# Patient Record
Sex: Female | Born: 1975 | Race: White | Hispanic: No | Marital: Married | Smoking: Never smoker
Health system: Southern US, Community
[De-identification: ages and names within clinical notes are randomized; demographics above are authoritative.]

## PROBLEM LIST (undated history)

## (undated) DIAGNOSIS — K589 Irritable bowel syndrome without diarrhea: Secondary | ICD-10-CM

## (undated) DIAGNOSIS — F419 Anxiety disorder, unspecified: Secondary | ICD-10-CM

## (undated) HISTORY — PX: GALLBLADDER SURGERY: SHX652

## (undated) HISTORY — PX: CHOLECYSTECTOMY: SHX55

---

## 2019-08-14 ENCOUNTER — Emergency Department (HOSPITAL_BASED_OUTPATIENT_CLINIC_OR_DEPARTMENT_OTHER)
Admission: EM | Admit: 2019-08-14 | Discharge: 2019-08-15 | Disposition: A | Discharge status: Home / Self Care | Payer: BC Managed Care – PPO | Attending: Emergency Medicine | Admitting: Emergency Medicine

## 2019-08-14 ENCOUNTER — Encounter (HOSPITAL_BASED_OUTPATIENT_CLINIC_OR_DEPARTMENT_OTHER): Payer: Self-pay | Admitting: *Deleted

## 2019-08-14 ENCOUNTER — Other Ambulatory Visit: Payer: Self-pay

## 2019-08-14 DIAGNOSIS — R112 Nausea with vomiting, unspecified: Secondary | ICD-10-CM | POA: Insufficient documentation

## 2019-08-14 DIAGNOSIS — R1084 Generalized abdominal pain: Secondary | ICD-10-CM

## 2019-08-14 DIAGNOSIS — Z9889 Other specified postprocedural states: Secondary | ICD-10-CM

## 2019-08-14 HISTORY — DX: Anxiety disorder, unspecified: F41.9

## 2019-08-14 HISTORY — DX: Irritable bowel syndrome without diarrhea: K58.9

## 2019-08-14 MED ORDER — ONDANSETRON 4 MG PO TBDP
ORAL_TABLET | ORAL | Status: AC
  Filled 2019-08-14: qty 1

## 2019-08-14 MED ORDER — ONDANSETRON 4 MG PO TBDP
4.0000 mg | ORAL_TABLET | Freq: Once | ORAL | Status: AC
  Administered 2019-08-14: 4 mg via ORAL

## 2019-08-14 NOTE — ED Triage Notes (Signed)
Gallbladder surgery today. She is here with c.o vomiting and pain. She last vomited 4 hours ago. She cannot keep her pain medication down. She took Phenergan 2 hours ago with no improvement.

## 2019-08-15 LAB — CBC
HCT: 34 % — ABNORMAL LOW (ref 36.0–46.0)
Hemoglobin: 11.6 g/dL — ABNORMAL LOW (ref 12.0–15.0)
MCH: 30.1 pg (ref 26.0–34.0)
MCHC: 34.1 g/dL (ref 30.0–36.0)
MCV: 88.3 fL (ref 80.0–100.0)
Platelets: 218 10*3/uL (ref 150–400)
RBC: 3.85 MIL/uL — ABNORMAL LOW (ref 3.87–5.11)
RDW: 13.5 % (ref 11.5–15.5)
WBC: 13.9 10*3/uL — ABNORMAL HIGH (ref 4.0–10.5)
nRBC: 0 % (ref 0.0–0.2)

## 2019-08-15 LAB — COMPREHENSIVE METABOLIC PANEL
ALT: 47 U/L — ABNORMAL HIGH (ref 0–44)
AST: 53 U/L — ABNORMAL HIGH (ref 15–41)
Albumin: 3.8 g/dL (ref 3.5–5.0)
Alkaline Phosphatase: 58 U/L (ref 38–126)
Anion gap: 10 (ref 5–15)
BUN: 7 mg/dL (ref 6–20)
CO2: 23 mmol/L (ref 22–32)
Calcium: 8.2 mg/dL — ABNORMAL LOW (ref 8.9–10.3)
Chloride: 97 mmol/L — ABNORMAL LOW (ref 98–111)
Creatinine, Ser: 0.5 mg/dL (ref 0.44–1.00)
GFR calc Af Amer: >60 mL/min (ref >60)
GFR calc non Af Amer: >60 mL/min (ref >60)
Glucose, Bld: 121 mg/dL — ABNORMAL HIGH (ref 70–99)
Potassium: 3.4 mmol/L — ABNORMAL LOW (ref 3.5–5.1)
Sodium: 130 mmol/L — ABNORMAL LOW (ref 135–145)
Total Bilirubin: 0.6 mg/dL (ref 0.3–1.2)
Total Protein: 7 g/dL (ref 6.5–8.1)

## 2019-08-15 MED ORDER — ONDANSETRON HCL 4 MG/2ML IJ SOLN
4.0000 mg | Freq: Once | INTRAMUSCULAR | Status: AC
  Administered 2019-08-15: 4 mg via INTRAVENOUS
  Filled 2019-08-15: qty 2

## 2019-08-15 MED ORDER — HYDROMORPHONE HCL 1 MG/ML IJ SOLN
0.5000 mg | Freq: Once | INTRAMUSCULAR | Status: AC
  Administered 2019-08-15: 0.5 mg via INTRAVENOUS
  Filled 2019-08-15: qty 1

## 2019-08-15 MED ORDER — OXYCODONE-ACETAMINOPHEN 5-325 MG PO TABS
0.5000 | ORAL_TABLET | Freq: Once | ORAL | Status: AC
  Administered 2019-08-15: 0.5 via ORAL
  Filled 2019-08-15: qty 1

## 2019-08-15 MED ORDER — SODIUM CHLORIDE 0.9 % IV SOLN
1000.0000 mL | INTRAVENOUS | Status: DC
  Administered 2019-08-15: 1000 mL via INTRAVENOUS

## 2019-08-15 MED ORDER — SODIUM CHLORIDE 0.9 % IV BOLUS (SEPSIS)
1000.0000 mL | Freq: Once | INTRAVENOUS | Status: AC
  Administered 2019-08-15: 1000 mL via INTRAVENOUS

## 2019-08-15 MED ORDER — ONDANSETRON 4 MG PO TBDP: 4.0000 mg | ORAL_TABLET | Freq: Three times a day (TID) | ORAL | 0 refills | Status: AC | PRN

## 2019-08-15 NOTE — ED Provider Notes (Signed)
[facility] EMERGENCY DEPARTMENT Provider Note  CSN: [id number] Arrival date & time: 08/14/19 2054  Chief Complaint(s) Abdominal Pain  HPI Marilyn Ingram is a 44 y.o. female   CC: abd pain  Onset/Duration: 12 hrs Timing: constant Location: generalized Quality: dull "soreness" Severity: mild to moderate Modifying Factors:  Improved by: nothing  Worsened by: emesis Associated Signs/Symptoms:  Pertinent (+): n/v  Pertinent (-): fevers, chills, chest pain, sob, cough Context: had Lap Chole and was DC'd this afternoon.  Initially prescribed Zofran pills which patient kept throwing up.  She called the surgeon who prescribed her Phenergan.  However she is unable to keep any of the pills down including her pain medicine.   HPI  Past Medical History Past Medical History:  Diagnosis Date  . Anxiety   . IBS (irritable bowel syndrome)    There are no problems to display for this patient.  Home Medication(s) Prior to Admission medications   Medication Sig Start Date End Date Taking? Authorizing Provider  ondansetron (ZOFRAN ODT) 4 MG disintegrating tablet Take 1 tablet (4 mg total) by mouth every 8 (eight) hours as needed for up to 3 days for nausea or vomiting. 08/15/19 08/18/19  Khamarion Bjelland, Amadeo Garnet, MD                                                                                                                                    Past Surgical History Past Surgical History:  Procedure Laterality Date  . CESAREAN SECTION    . GALLBLADDER SURGERY     Family History No family history on file.  Social History Social History   Tobacco Use  . Smoking status: Not on file  Substance Use Topics  . Alcohol use: Not on file  . Drug use: Not on file   Allergies Patient has no allergy information on record.  Review of Systems Review of Systems All other systems are reviewed and are negative for acute change except as noted in the HPI  Physical Exam Vital Signs    I have reviewed the triage vital signs BP (!) 139/92   Pulse 91   Temp 97.9 F (36.6 C) (Oral)   Resp 20   Ht 5\' 5"  (1.651 m)   Wt 81.6 kg   LMP 07/31/2019   SpO2 97%   BMI 29.95 kg/m   Physical Exam Vitals reviewed.  Constitutional:      General: She is not in acute distress.    Appearance: She is well-developed. She is not diaphoretic.  HENT:     Head: Normocephalic and atraumatic.     Nose: Nose normal.  Eyes:     General: No scleral icterus.       Right eye: No discharge.        Left eye: No discharge.     Conjunctiva/sclera: Conjunctivae normal.     Pupils: Pupils are equal, round, and reactive to light.  Cardiovascular:  Rate and Rhythm: Normal rate and regular rhythm.     Heart sounds: No murmur heard.  No friction rub. No gallop.   Pulmonary:     Effort: Pulmonary effort is normal. No respiratory distress.     Breath sounds: Normal breath sounds. No stridor. No rales.  Abdominal:     General: There is no distension.     Palpations: Abdomen is soft.     Tenderness: There is abdominal tenderness (mild discomfort). There is no guarding or rebound.     Comments: Trochar incision clean, dry, intact  Musculoskeletal:        General: No tenderness.     Cervical back: Normal range of motion and neck supple.  Skin:    General: Skin is warm and dry.     Findings: No erythema or rash.  Neurological:     Mental Status: She is alert and oriented to person, place, and time.     ED Results and Treatments Labs (all labs ordered are listed, but only abnormal results are displayed) Labs Reviewed  COMPREHENSIVE METABOLIC PANEL - Abnormal; Notable for the following components:      Result Value   Sodium 130 (*)    Potassium 3.4 (*)    Chloride 97 (*)    Glucose, Bld 121 (*)    Calcium 8.2 (*)    AST 53 (*)    ALT 47 (*)    All other components within normal limits  CBC - Abnormal; Notable for the following components:   WBC 13.9 (*)    RBC 3.85 (*)     Hemoglobin 11.6 (*)    HCT 34.0 (*)    All other components within normal limits                                                                                                                         EKG  EKG Interpretation  Date/Time:    Ventricular Rate:    PR Interval:    QRS Duration:   QT Interval:    QTC Calculation:   R Axis:     Text Interpretation:        Radiology No results found.  Pertinent labs & imaging results that were available during my care of the patient were reviewed by me and considered in my medical decision making (see chart for details).  Medications Ordered in ED Medications  sodium chloride 0.9 % bolus 1,000 mL (0 mLs Intravenous Stopped 08/15/19 0152)    Followed by  0.9 %  sodium chloride infusion (1,000 mLs Intravenous New Bag/Given 08/15/19 0157)  ondansetron (ZOFRAN-ODT) disintegrating tablet 4 mg (4 mg Oral Given 08/14/19 2105)  ondansetron (ZOFRAN) injection 4 mg (4 mg Intravenous Given 08/15/19 0036)  HYDROmorphone (DILAUDID) injection 0.5 mg (0.5 mg Intravenous Given 08/15/19 0036)  oxyCODONE-acetaminophen (PERCOCET/ROXICET) 5-325 MG per tablet 0.5 tablet (0.5 tablets Oral Given 08/15/19 0213)  Procedures Procedures  (including critical care time)  Medical Decision Making / ED Course I have reviewed the nursing notes for this encounter and the patient's prior records (if available in EHR or on provided paperwork).   Marilyn Ingram was evaluated in Emergency Department on 08/15/2019 for the symptoms described in the history of present illness. She was evaluated in the context of the global COVID-19 pandemic, which necessitated consideration that the patient might be at risk for infection with the SARS-CoV-2 virus that causes COVID-19. Institutional protocols and algorithms that pertain to the evaluation of patients at  risk for COVID-19 are in a state of rapid change based on information released by regulatory bodies including the CDC and federal and state organizations. These policies and algorithms were followed during the patient's care in the ED.  Patient presents with postoperative abdominal discomfort with nausea and emesis.  Main concern is the inability to keep anything down.  She is afebrile with stable vital signs.  Abdomen with mild discomfort but no evidence of peritonitis. Surgical incisions are well-appearing  Labs are expected with leukocytosis likely due to recent surgery/demargination from emesis. Mild hyponatremia, hypochloremia and hypokalemia I have low suspicion for serious intra-abdominal inflammatory/infectious process/bowel obstruction at this time, requiring imaging.  This was discussed with the patient and husband who chose to defer imaging at this time.  Patient was treated symptomatically with antiemetics, pain medicine and IV fluids.  She was monitored for several hours with improved pain and resolved nausea.  She was able to keep fluids and oral pain medicine down.       Final Clinical Impression(s) / ED Diagnoses Final diagnoses:  Post-operative nausea and vomiting    The patient appears reasonably screened and/or stabilized for discharge and I doubt any other medical condition or other [facility] requiring further screening, evaluation, or treatment in the ED at this time prior to discharge. Safe for discharge with strict return precautions.  Disposition: Discharge  Condition: Good  I have discussed the results, Dx and Tx plan with the patient/family who expressed understanding and agree(s) with the plan. Discharge instructions discussed at length. The patient/family was given strict return precautions who verbalized understanding of the instructions. No further questions at time of discharge.    ED Discharge Orders         Ordered    ondansetron (ZOFRAN ODT) 4 MG  disintegrating tablet  Every 8 hours PRN     Discontinue  Reprint     08/15/19 0208             Follow Up: Enzo Bi, MD [address] [place] [address] [phone]  Call  As needed     This chart was dictated using voice recognition software.  Despite best efforts to proofread,  errors can occur which can change the documentation meaning.   Nira Conn, MD 08/15/19 [phone]

## 2019-08-19 ENCOUNTER — Encounter (HOSPITAL_BASED_OUTPATIENT_CLINIC_OR_DEPARTMENT_OTHER): Payer: Self-pay | Admitting: *Deleted

## 2019-08-19 ENCOUNTER — Emergency Department (HOSPITAL_BASED_OUTPATIENT_CLINIC_OR_DEPARTMENT_OTHER)
Admission: EM | Admit: 2019-08-19 | Discharge: 2019-08-19 | Disposition: A | Discharge status: Home / Self Care | Payer: BC Managed Care – PPO | Attending: Emergency Medicine | Admitting: Emergency Medicine

## 2019-08-19 ENCOUNTER — Emergency Department (HOSPITAL_BASED_OUTPATIENT_CLINIC_OR_DEPARTMENT_OTHER): Payer: BC Managed Care – PPO

## 2019-08-19 ENCOUNTER — Other Ambulatory Visit: Payer: Self-pay

## 2019-08-19 DIAGNOSIS — M79605 Pain in left leg: Secondary | ICD-10-CM | POA: Insufficient documentation

## 2019-08-19 DIAGNOSIS — Z79899 Other long term (current) drug therapy: Secondary | ICD-10-CM | POA: Diagnosis not present

## 2019-08-19 DIAGNOSIS — R059 Cough, unspecified: Secondary | ICD-10-CM

## 2019-08-19 DIAGNOSIS — R05 Cough: Secondary | ICD-10-CM | POA: Insufficient documentation

## 2019-08-19 NOTE — ED Notes (Signed)
ED Provider at bedside. 

## 2019-08-19 NOTE — ED Triage Notes (Signed)
Surgery this past Monday, cholecystectomy, has had some N/V. Yesterday began having pain behind left knee, developed a dry hack cough recently as well. Slight Homan's sign in left leg. Denies any SOB at this time

## 2019-08-19 NOTE — ED Provider Notes (Signed)
[facility] EMERGENCY DEPARTMENT Provider Note   CSN: [id number] Arrival date & time: 08/19/19  0935     History Chief Complaint  Patient presents with   Leg Pain    Noheli Melder is a 44 y.o. female p.o. day 5 from lap chole who presents today for evaluation of cough and pain behind her left knee.  She reports that yesterday she started developing pain behind her right knee.  She does not have a history of DVT/PE.  She denies any leg swelling.  He states that about 3 days ago she developed a dry cough.  She is fully vaccinated against coronavirus.  She denies any fevers.  She reports that her abdomen has bruised around the incision sites however overall she is improving and doing better.  She states that she has been up and moving around as much as she can.  She denies any chest pain.  HPI     Past Medical History:  Diagnosis Date   Anxiety    IBS (irritable bowel syndrome)     There are no problems to display for this patient.   Past Surgical History:  Procedure Laterality Date   CESAREAN SECTION     CHOLECYSTECTOMY     GALLBLADDER SURGERY       OB History   No obstetric history on file.     No family history on file.  Social History   Tobacco Use   Smoking status: Never Smoker   Smokeless tobacco: Never Used  Vaping Use   Vaping Use: Never used  Substance Use Topics   Alcohol use: Never   Drug use: Never    Home Medications Prior to Admission medications   Medication Sig Start Date End Date Taking? Authorizing Provider  acetaminophen (TYLENOL) 325 MG tablet Take 325 mg by mouth every 6 (six) hours as needed.   Yes [provider]  buPROPion (WELLBUTRIN) 100 MG tablet Take 100 mg by mouth 2 (two) times daily.   Yes [provider]  HYDROcodone-acetaminophen (NORCO/VICODIN) 5-325 MG tablet Take 1 tablet by mouth every 6 (six) hours as needed for moderate pain.   Yes [provider]  pantoprazole (PROTONIX)  20 MG tablet Take 20 mg by mouth daily.   Yes [provider]  promethazine (PHENERGAN) 12.5 MG tablet Take 12.5 mg by mouth every 6 (six) hours as needed. 08/14/19   [provider]    Allergies    Patient has no known allergies.  Review of Systems   Review of Systems  Constitutional: Negative for chills and fever.  Respiratory: Positive for cough.   Gastrointestinal: Positive for abdominal pain (IMproving).  Genitourinary: Negative for dysuria.  Musculoskeletal:       Pain in left popliteal area  Neurological: Negative for weakness and headaches.  All other systems reviewed and are negative.   Physical Exam Updated Vital Signs BP 127/79 (BP Location: Right Arm)    Pulse 89    Temp 98.2 F (36.8 C)    Resp 18    Ht 5\' 5"  (1.651 m)    Wt 81.6 kg    LMP 07/31/2019    SpO2 100%    BMI 29.95 kg/m   Physical Exam Vitals and nursing note reviewed.  Constitutional:      General: She is not in acute distress.    Appearance: She is well-developed. She is not diaphoretic.  HENT:     Head: Normocephalic and atraumatic.  Eyes:     General:  No scleral icterus.       Right eye: No discharge.        Left eye: No discharge.     Conjunctiva/sclera: Conjunctivae normal.  Cardiovascular:     Rate and Rhythm: Normal rate and regular rhythm.     Pulses: Normal pulses.     Heart sounds: Normal heart sounds.  Pulmonary:     Effort: Pulmonary effort is normal. No respiratory distress.     Breath sounds: Normal breath sounds. No stridor.     Comments: Frequent dry, nonproductive cough Abdominal:     General: There is no distension.  Musculoskeletal:        General: No deformity.     Cervical back: Normal range of motion.     Right lower leg: No edema.     Left lower leg: No edema.  Skin:    General: Skin is warm and dry.  Neurological:     General: No focal deficit present.     Mental Status: She is alert.     Motor: No abnormal muscle tone.  Psychiatric:         Mood and Affect: Mood normal.        Behavior: Behavior normal.     ED Results / Procedures / Treatments   Labs (all labs ordered are listed, but only abnormal results are displayed) Labs Reviewed - No data to display  EKG None  Radiology DG Chest 2 View  Result Date: 08/19/2019 CLINICAL DATA:  Cough. EXAM: CHEST - 2 VIEW COMPARISON:  None. FINDINGS: The heart size and mediastinal contours are within normal limits. Both lungs are clear. The visualized skeletal structures are unremarkable. IMPRESSION: No active cardiopulmonary disease. Electronically Signed   By: Lupita Raider M.D.   On: 08/19/2019 11:33   US Venous Img Lower  Left (DVT Study)  Result Date: 08/19/2019 CLINICAL DATA:  Left posterior knee pain radiating to the foot for 2 days. Status post cholecystectomy on 08/14/2019 EXAM: LEFT LOWER EXTREMITY VENOUS DOPPLER ULTRASOUND TECHNIQUE: Gray-scale sonography with compression, as well as color and duplex ultrasound, were performed to evaluate the deep venous system(s) from the level of the common femoral vein through the popliteal and proximal calf veins. COMPARISON:  None. FINDINGS: VENOUS Normal compressibility of the common femoral, superficial femoral, and popliteal veins, as well as the visualized calf veins. Visualized portions of profunda femoral vein and great saphenous vein unremarkable. No filling defects to suggest DVT on grayscale or color Doppler imaging. Doppler waveforms show normal direction of venous flow, normal respiratory plasticity and response to augmentation. Limited views of the contralateral common femoral vein are unremarkable. OTHER None. Limitations: none IMPRESSION: No evidence of DVT in the left lower extremity. Electronically Signed   By: Emmaline Kluver M.D.   On: 08/19/2019 11:32    Procedures Procedures (including critical care time)  Medications Ordered in ED Medications - No data to display  ED Course  I have reviewed the triage vital signs  and the nursing notes.  Pertinent labs & imaging results that were available during my care of the patient were reviewed by me and considered in my medical decision making (see chart for details).    MDM Rules/Calculators/A&P                          Patient is a 44 year old woman who presents today for evaluation of cough and pain behind her left knee.  She is postop recent  cholecystectomy.  No history of DVT.  Based on her recent surgery she however is at increased risk for DVT.  DVT study was performed without evidence of DVT.  Based on her cough chest x-ray was performed without evidence of consolidation, pneumothorax, or other cause for her symptoms.  She is afebrile, not tachycardic or tachypneic and at 100% on room air.  Recommended continued observation outpatient along with close follow-up with her surgeon and primary care doctor.  She is fully vaccinated against covid.   Return precautions were discussed with patient who states their understanding.  At the time of discharge patient denied any unaddressed complaints or concerns.  Patient is agreeable for discharge home.  Note: Portions of this report may have been transcribed using voice recognition software. Every effort was made to ensure accuracy; however, inadvertent computerized transcription errors may be present   Final Clinical Impression(s) / ED Diagnoses Final diagnoses:  Left leg pain  Cough    Rx / DC Orders ED Discharge Orders    None       Cristina Gong, PA-C 08/19/19 1827    Terald Sleeper, MD 08/20/19 [phone]

## 2019-08-19 NOTE — Discharge Instructions (Signed)
Please make sure you are drinking plenty of water and staying well hydrated.  If you develop fevers, worsening pain in your leg, significant swelling in your leg, shortness of breath, chest pain, or have other concerns please seek additional medical care and evaluation.  Anytime you are concerned enough about an issue to seek evaluation in the emergency room I always recommend that you schedule a follow-up appointment with your primary care doctor in the next week.

## 2021-02-23 IMAGING — US US EXTREM LOW VENOUS*L*
1 series · 14 of 24 positions shown · non-contrast
Comparison: None.

CLINICAL DATA: Left posterior knee pain radiating to the foot for 2
days. Status post cholecystectomy on 08/14/2019

EXAM:
LEFT LOWER EXTREMITY VENOUS DOPPLER ULTRASOUND
TECHNIQUE: Gray-scale sonography with compression, as well as color and duplex
ultrasound, were performed to evaluate the deep venous system(s)
from the level of the common femoral vein through the popliteal and
proximal calf veins.

[Series 1: us extrem low venous*left* · 14 of 32 slices shown]
[im 1/32]
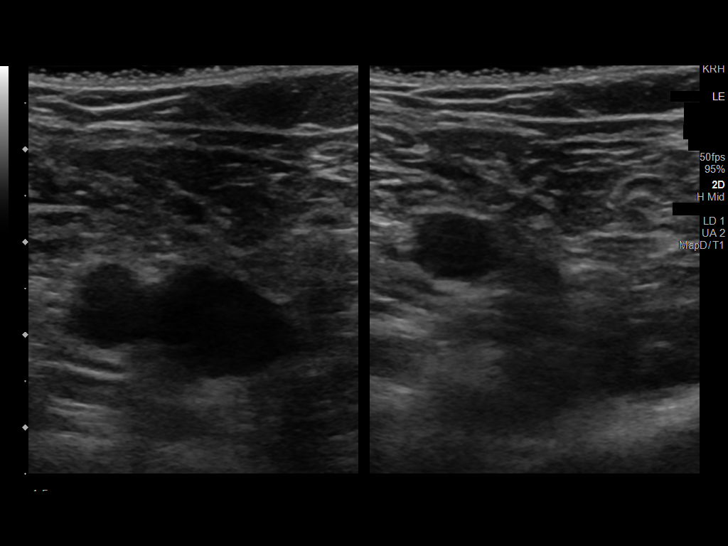
[im 3/32]
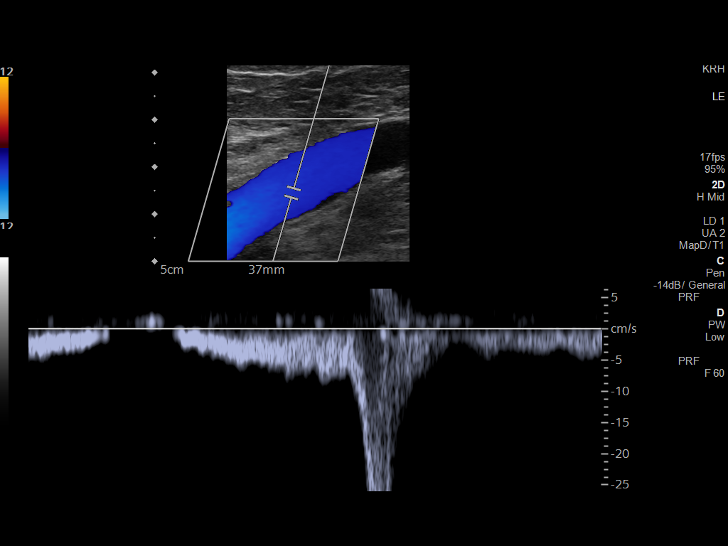
[im 6/32]
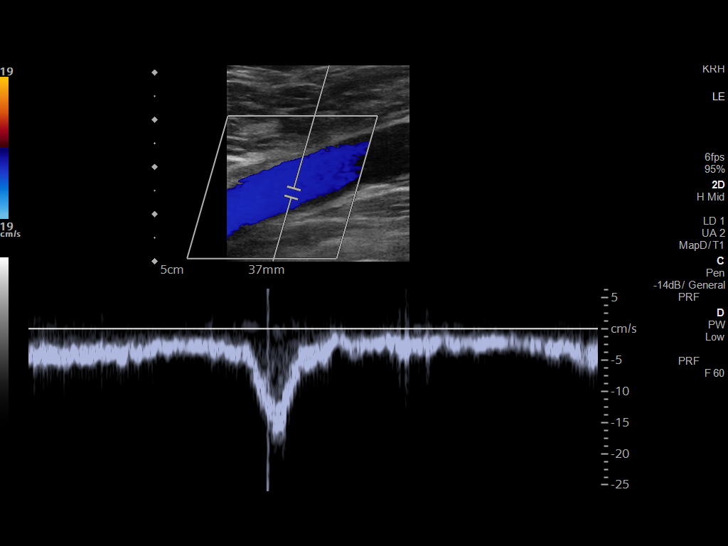
[im 9/32]
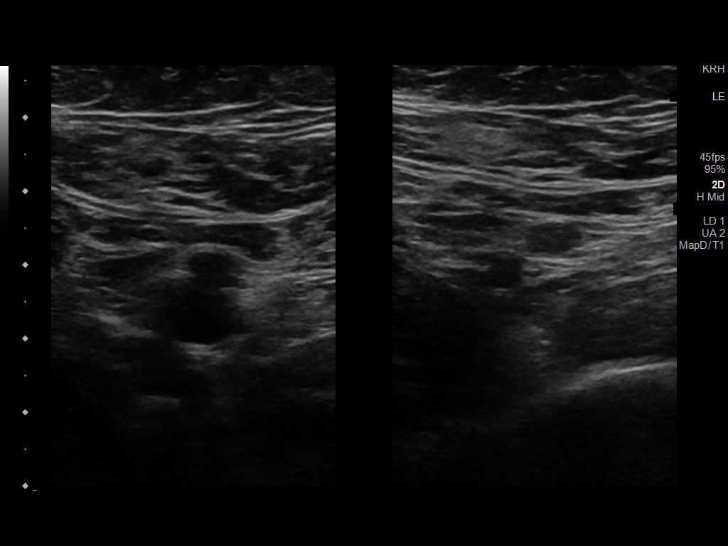
[im 10/32]
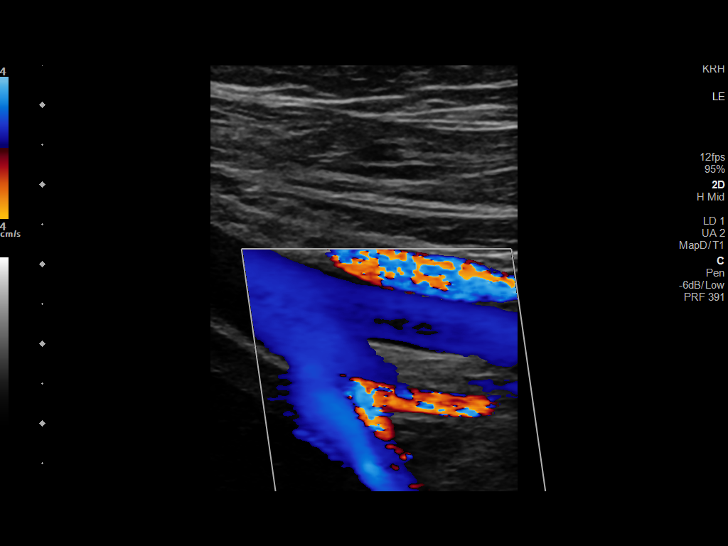
[im 13/32]
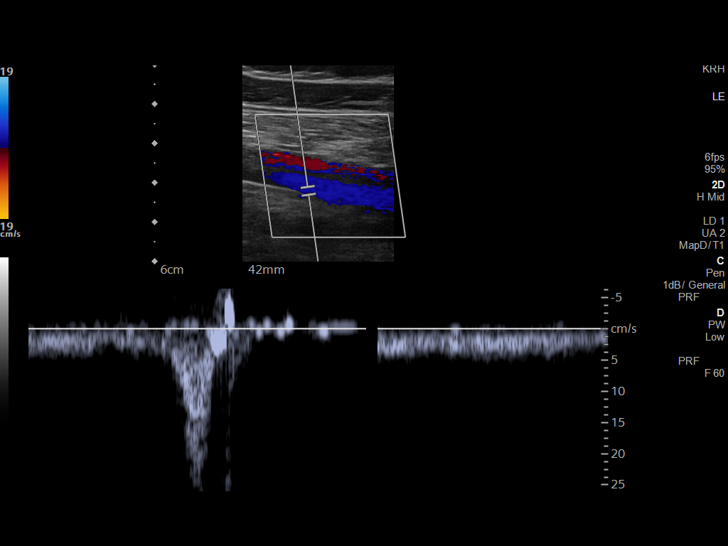
[im 15/32]
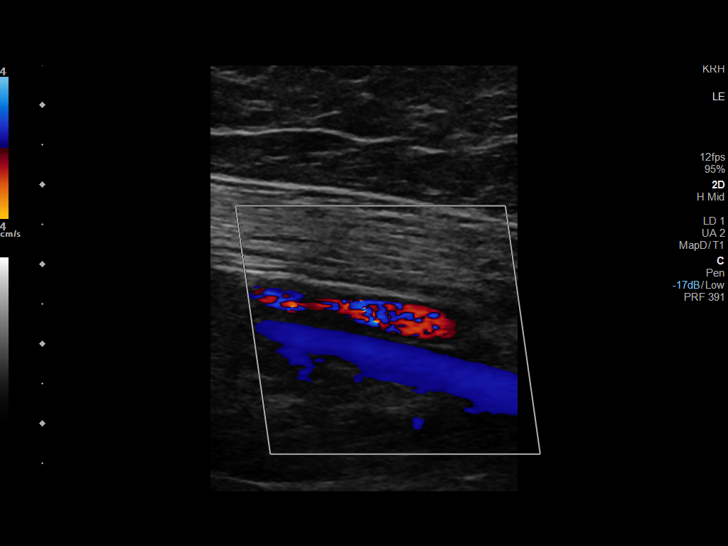
[im 17/32]
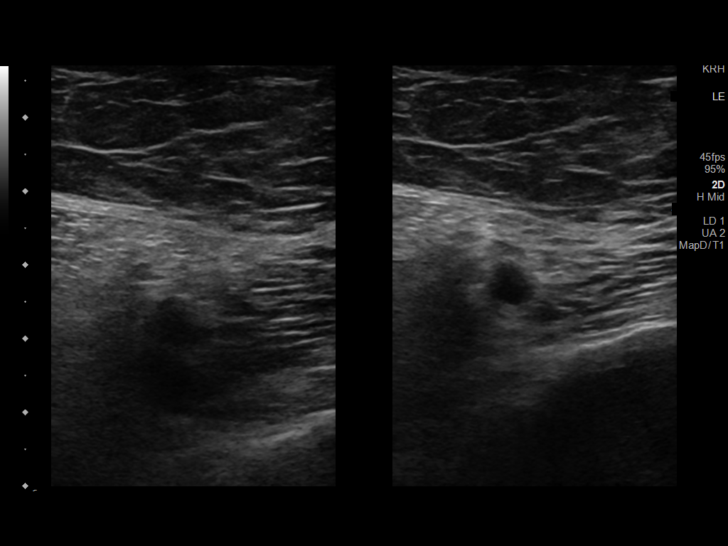
[im 19/32]
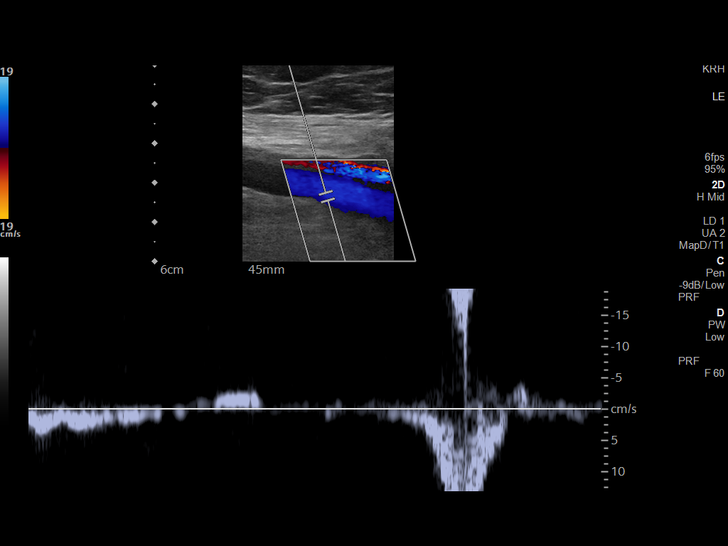
[im 22/32]
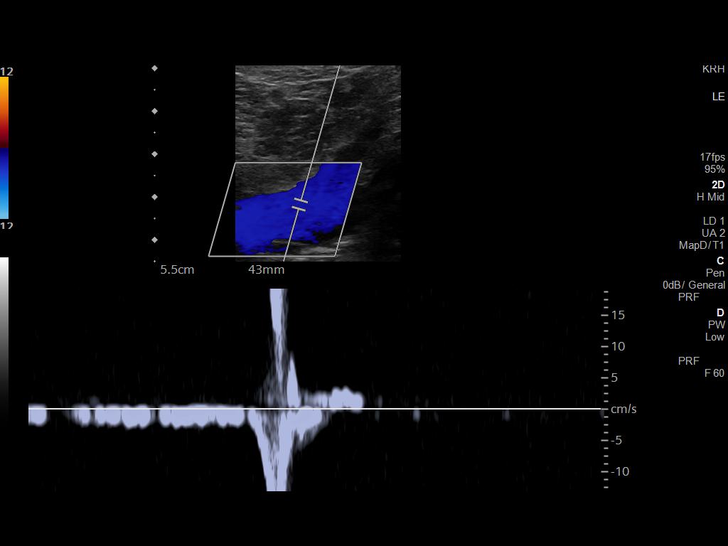
[im 25/32]
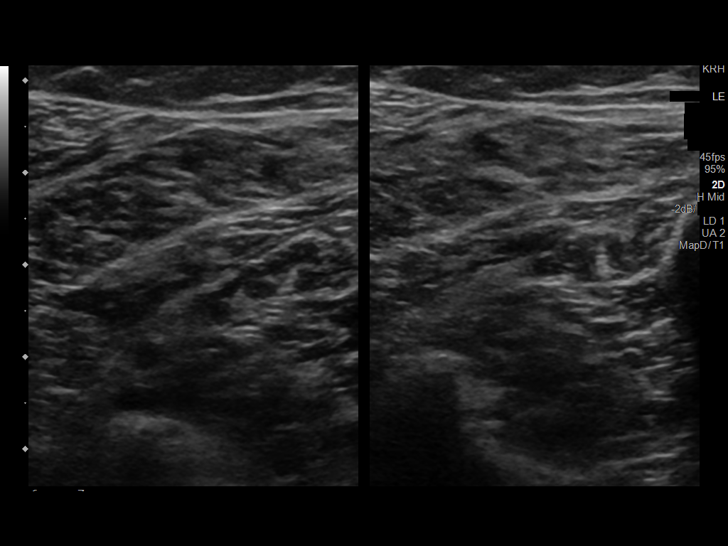
[im 26/32]
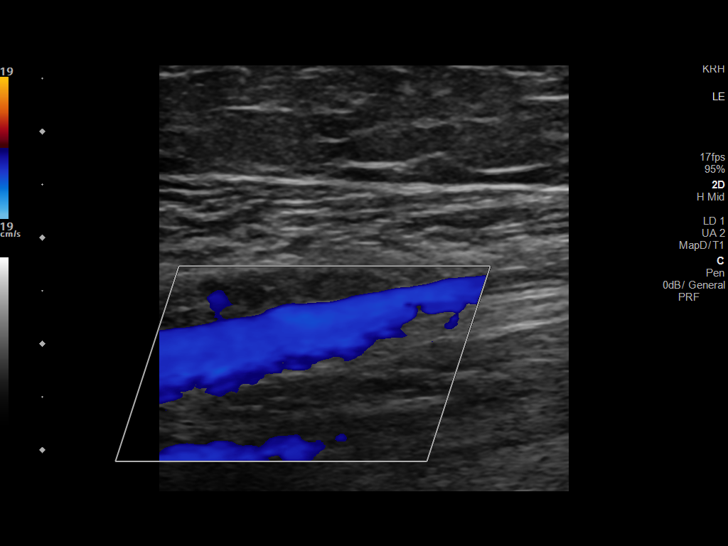
[im 29/32]
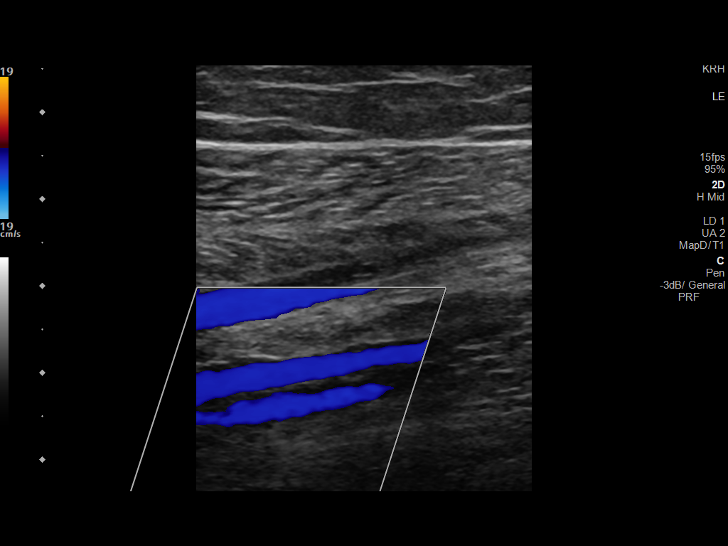
[im 32/32]
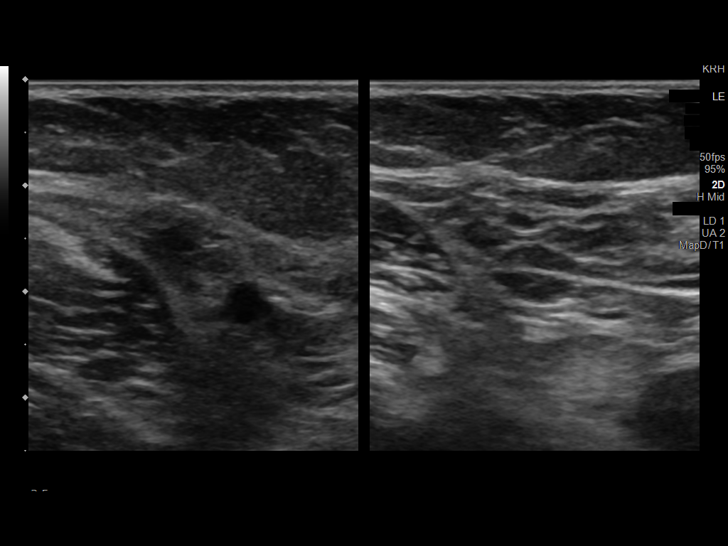

[14 of 24 positions shown; findings below may reference images not displayed]

FINDINGS: VENOUS

Normal compressibility of the common femoral, superficial femoral,
and popliteal veins, as well as the visualized calf veins.
Visualized portions of profunda femoral vein and great saphenous
vein unremarkable. No filling defects to suggest DVT on grayscale or
color Doppler imaging. Doppler waveforms show normal direction of
venous flow, normal respiratory plasticity and response to
augmentation.

Limited views of the contralateral common femoral vein are
unremarkable.

OTHER

None.

Limitations: none
IMPRESSION: No evidence of DVT in the left lower extremity.

## 2021-02-23 IMAGING — CR DG CHEST 2V
2 series · 2 of 2 positions shown · non-contrast
Comparison: None.

CLINICAL DATA: Cough.

EXAM:
CHEST - 2 VIEW

[w chest pa]
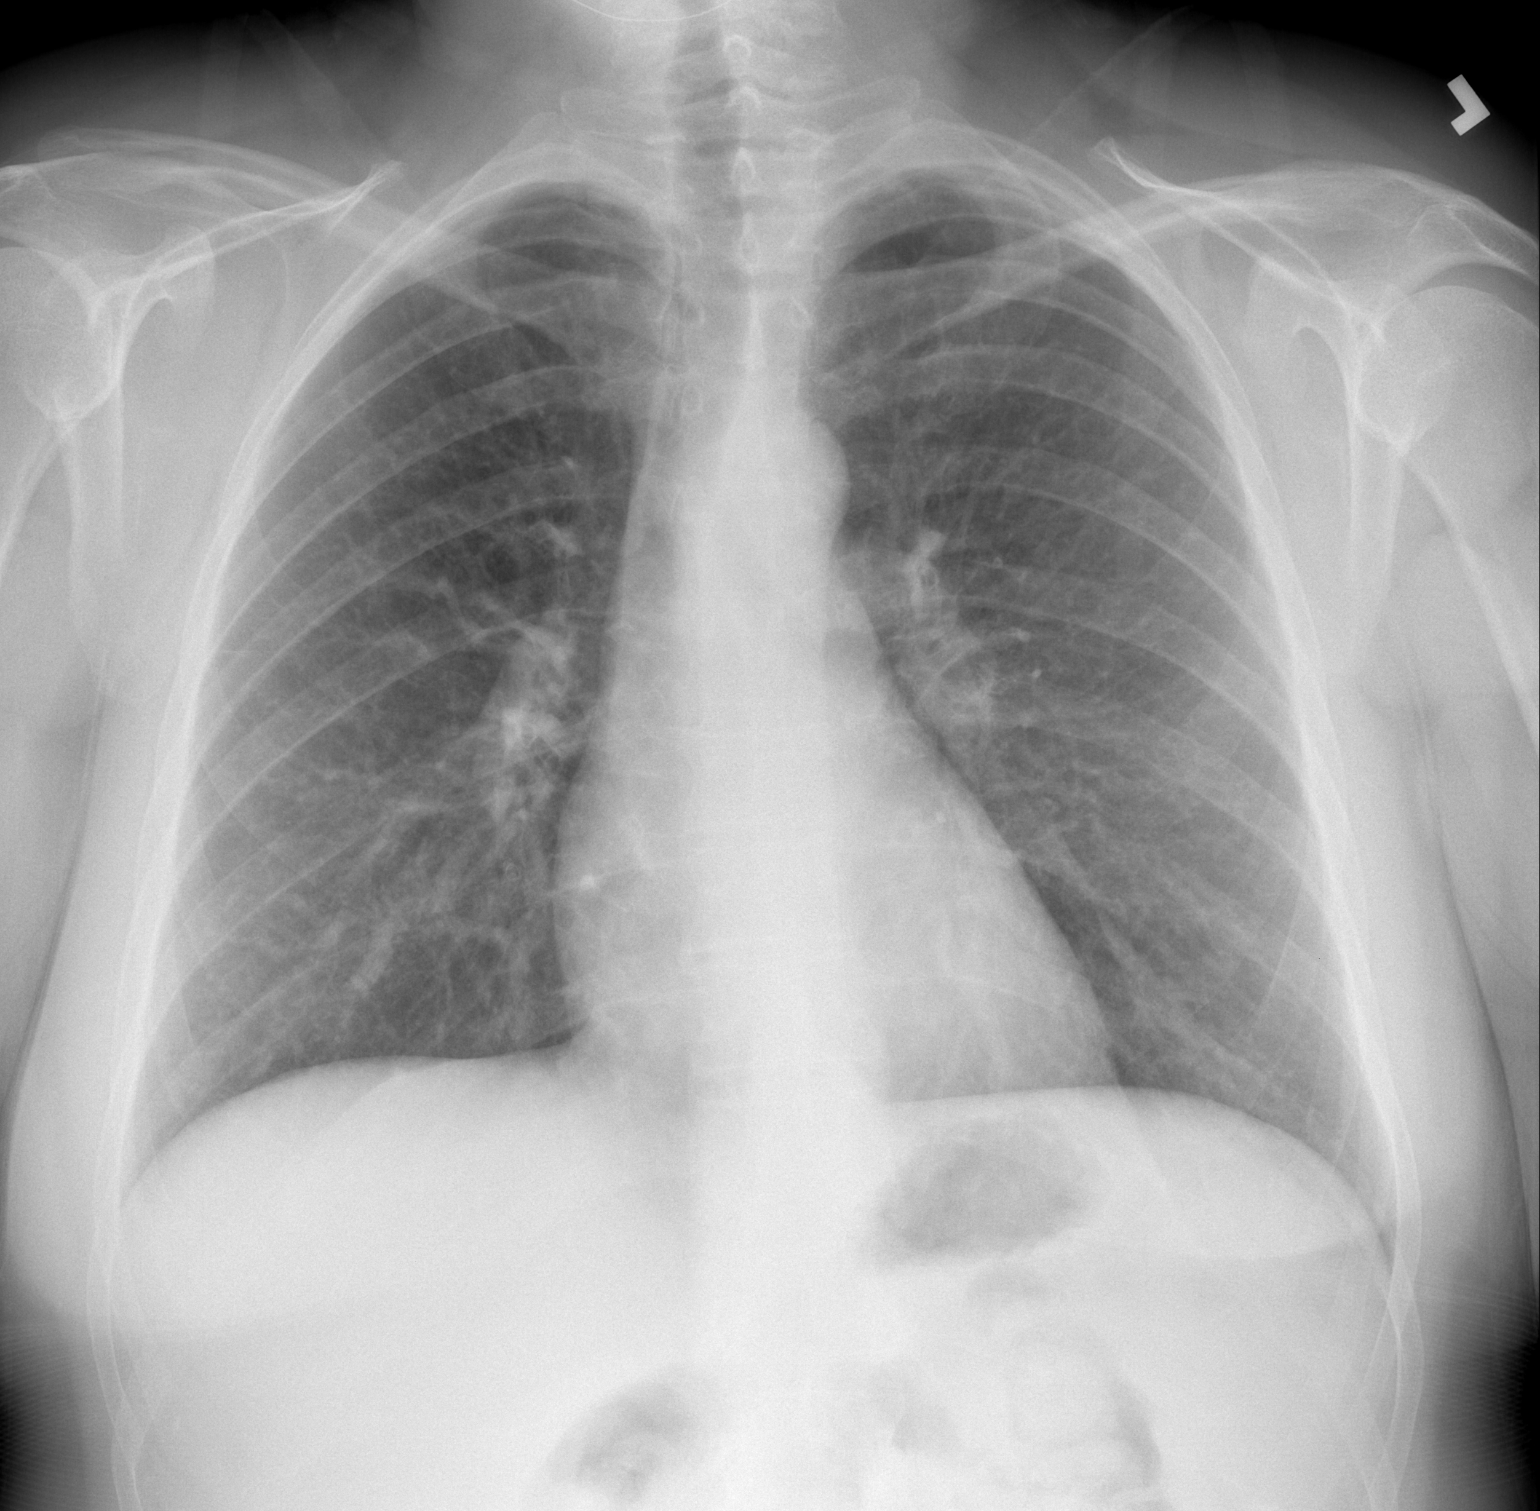

[w chest lat]
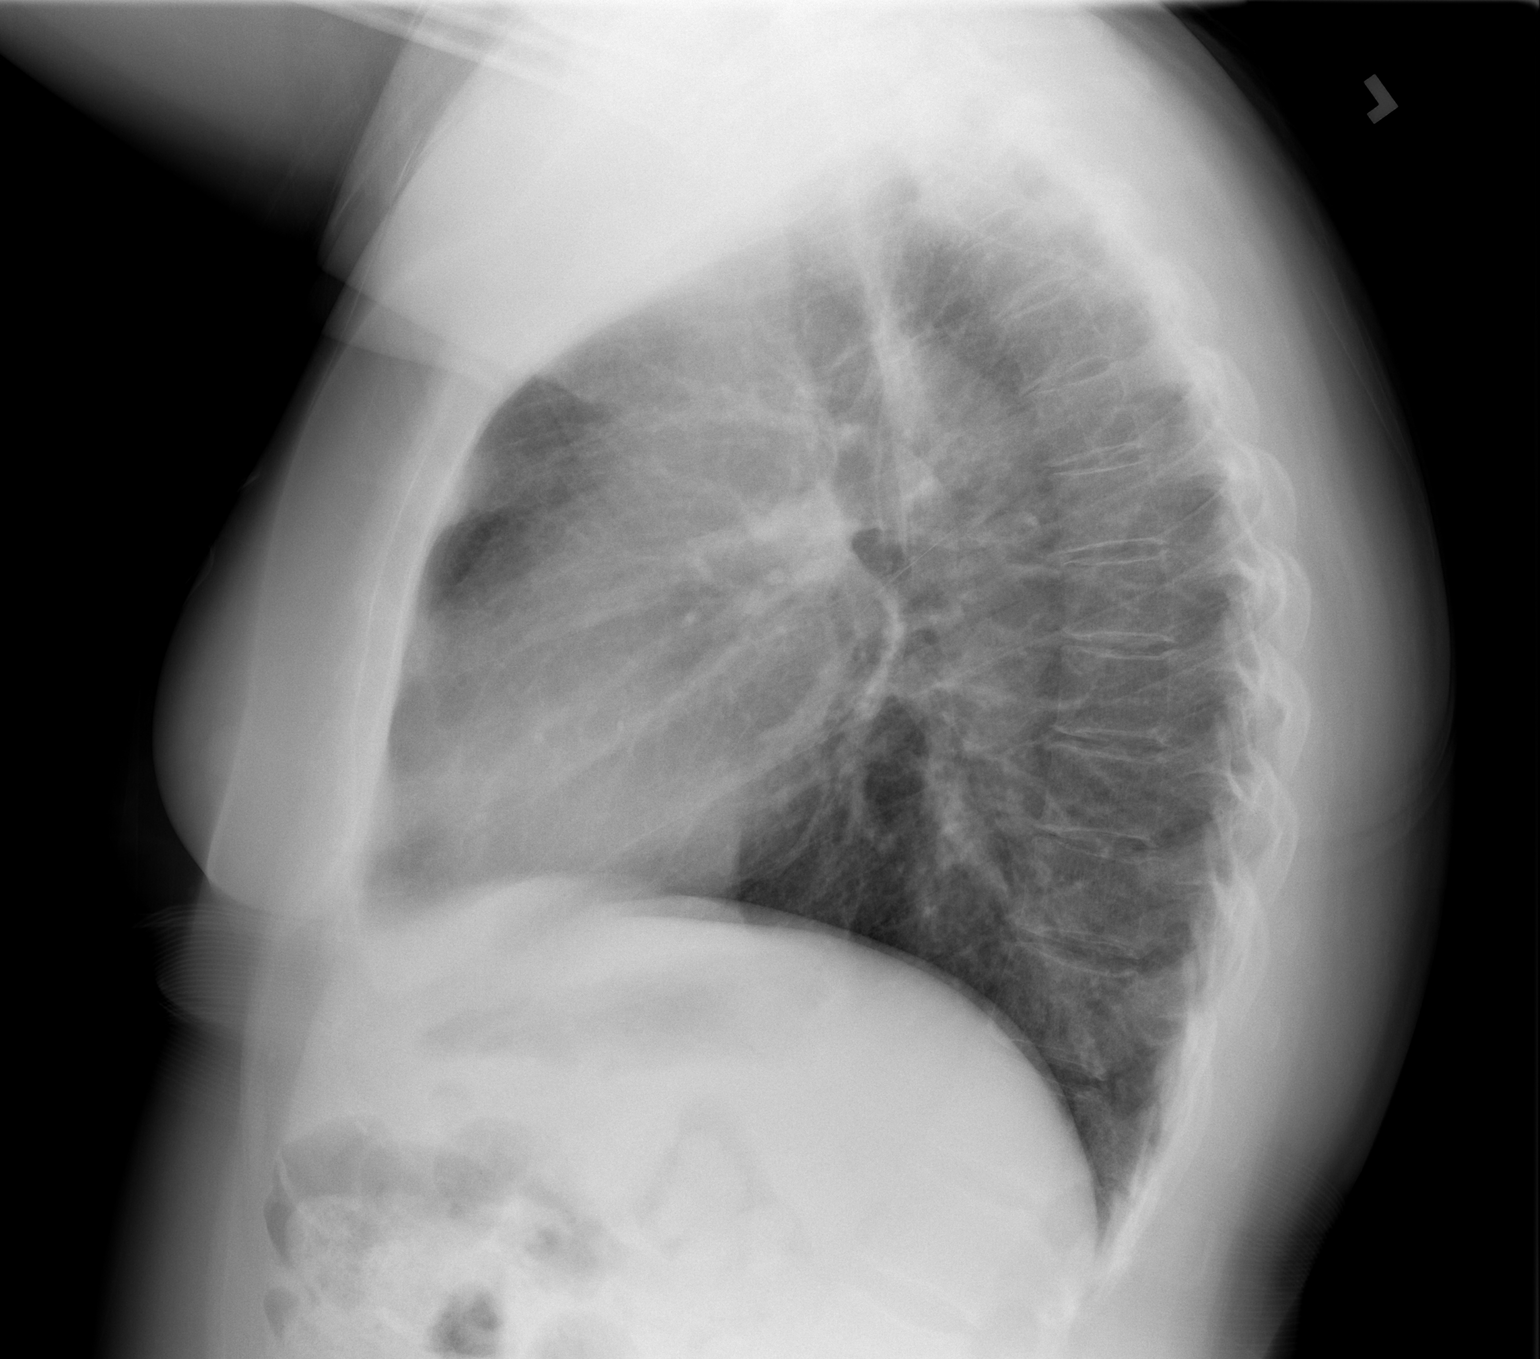

[2 of 2 positions shown; findings below may reference images not displayed]

FINDINGS: The heart size and mediastinal contours are within normal limits.
Both lungs are clear. The visualized skeletal structures are
unremarkable.
IMPRESSION: No active cardiopulmonary disease.

## 2022-04-12 ENCOUNTER — Emergency Department (HOSPITAL_BASED_OUTPATIENT_CLINIC_OR_DEPARTMENT_OTHER): Payer: BC Managed Care – PPO

## 2022-04-12 ENCOUNTER — Emergency Department (HOSPITAL_BASED_OUTPATIENT_CLINIC_OR_DEPARTMENT_OTHER)
Admission: EM | Admit: 2022-04-12 | Discharge: 2022-04-12 | Disposition: A | Discharge status: Home / Self Care | Payer: BC Managed Care – PPO | Attending: Emergency Medicine | Admitting: Emergency Medicine

## 2022-04-12 ENCOUNTER — Other Ambulatory Visit: Payer: Self-pay

## 2022-04-12 ENCOUNTER — Encounter (HOSPITAL_BASED_OUTPATIENT_CLINIC_OR_DEPARTMENT_OTHER): Payer: Self-pay | Admitting: Emergency Medicine

## 2022-04-12 DIAGNOSIS — R0789 Other chest pain: Secondary | ICD-10-CM | POA: Diagnosis present

## 2022-04-12 LAB — CBC WITH DIFFERENTIAL/PLATELET
Abs Immature Granulocytes: 0.02 10*3/uL (ref 0.00–0.07)
Basophils Absolute: 0 10*3/uL (ref 0.0–0.1)
Basophils Relative: 1 %
Eosinophils Absolute: 0.1 10*3/uL (ref 0.0–0.5)
Eosinophils Relative: 2 %
HCT: 41.4 % (ref 36.0–46.0)
Hemoglobin: 14.2 g/dL (ref 12.0–15.0)
Immature Granulocytes: 0 %
Lymphocytes Relative: 26 %
Lymphs Abs: 1.7 10*3/uL (ref 0.7–4.0)
MCH: 30.2 pg (ref 26.0–34.0)
MCHC: 34.3 g/dL (ref 30.0–36.0)
MCV: 88.1 fL (ref 80.0–100.0)
Monocytes Absolute: 0.5 10*3/uL (ref 0.1–1.0)
Monocytes Relative: 8 %
Neutro Abs: 4.2 10*3/uL (ref 1.7–7.7)
Neutrophils Relative %: 63 %
Platelets: 188 10*3/uL (ref 150–400)
RBC: 4.7 MIL/uL (ref 3.87–5.11)
RDW: 12.6 % (ref 11.5–15.5)
WBC: 6.7 10*3/uL (ref 4.0–10.5)
nRBC: 0 % (ref 0.0–0.2)

## 2022-04-12 LAB — BASIC METABOLIC PANEL WITH GFR
Anion gap: 8 (ref 5–15)
BUN: 15 mg/dL (ref 6–20)
CO2: 25 mmol/L (ref 22–32)
Calcium: 9.3 mg/dL (ref 8.9–10.3)
Chloride: 103 mmol/L (ref 98–111)
Creatinine, Ser: 0.74 mg/dL (ref 0.44–1.00)
GFR, Estimated: >60 mL/min
Glucose, Bld: 100 mg/dL — ABNORMAL HIGH (ref 70–99)
Potassium: 3.8 mmol/L (ref 3.5–5.1)
Sodium: 136 mmol/L (ref 135–145)

## 2022-04-12 LAB — TROPONIN I (HIGH SENSITIVITY): Troponin I (High Sensitivity): <2 ng/L (ref <18)

## 2022-04-12 MED ORDER — ALUM & MAG HYDROXIDE-SIMETH 200-200-20 MG/5ML PO SUSP
30.0000 mL | Freq: Once | ORAL | Status: AC
  Administered 2022-04-12: 30 mL via ORAL
  Filled 2022-04-12: qty 30

## 2022-04-12 NOTE — ED Notes (Signed)
Patient transported to X-ray 

## 2022-04-12 NOTE — ED Triage Notes (Signed)
Pt reports L sided chest pain that radiates to back and shoulder blade. Pt states it initially started on Friday but resolved after Tums. She states that this morning it woke her from sleep and was worse than it has been. She also reports some lightheadedness and intermittent diaphoresis. She has hx of cholecystectomy. Denies nausea.

## 2022-04-12 NOTE — ED Provider Notes (Signed)
[facility]  Provider Note  CSN: [id number] Arrival date & time: 04/12/22 0547  History Chief Complaint  Patient presents with   Chest Pain    Marilyn Ingram is a 47 y.o. female with history of GERD, prior cholecystectomy here with left sided chest pressure onset about 24 hours ago, woke her from sleep then, seemed to be improved after TUMS but continued through the day yesterday and woke her up again twice during the night tonight. She reports discomfort and pressure more than pain. Radiates into LUE and L upper back. She denies any SOB. Has had mild cough but no fever. She denies any HTN, DM or HDL. No known CAD. She denies recent travel, surgery, leg swelling or estrogen use.    Home Medications Prior to Admission medications   Medication Sig Start Date End Date Taking? Authorizing Provider  acetaminophen (TYLENOL) 325 MG tablet Take 325 mg by mouth every 6 (six) hours as needed.    [provider]  buPROPion (WELLBUTRIN) 100 MG tablet Take 100 mg by mouth 2 (two) times daily.    [provider]  HYDROcodone-acetaminophen (NORCO/VICODIN) 5-325 MG tablet Take 1 tablet by mouth every 6 (six) hours as needed for moderate pain.    [provider]  pantoprazole (PROTONIX) 20 MG tablet Take 20 mg by mouth daily.    [provider]  promethazine (PHENERGAN) 12.5 MG tablet Take 12.5 mg by mouth every 6 (six) hours as needed. 08/14/19   [provider]     Allergies    Patient has no known allergies.   Review of Systems   Review of Systems Please see HPI for pertinent positives and negatives  Physical Exam BP 136/74 (BP Location: Right Arm)   Pulse 99   Resp 20   Ht '5\' 5"'$  (1.651 m)   Wt 83.9 kg   LMP 03/22/2022   SpO2 100%   BMI 30.79 kg/m   Physical Exam Vitals and nursing note reviewed.  Constitutional:      Appearance: Normal appearance.  HENT:     Head: Normocephalic and  atraumatic.     Nose: Nose normal.     Mouth/Throat:     Mouth: Mucous membranes are moist.  Eyes:     Extraocular Movements: Extraocular movements intact.     Conjunctiva/sclera: Conjunctivae normal.  Cardiovascular:     Rate and Rhythm: Normal rate.  Pulmonary:     Effort: Pulmonary effort is normal.     Breath sounds: Normal breath sounds.  Abdominal:     General: Abdomen is flat.     Palpations: Abdomen is soft.     Tenderness: There is no abdominal tenderness.  Musculoskeletal:        General: No swelling. Normal range of motion.     Cervical back: Neck supple.  Skin:    General: Skin is warm and dry.  Neurological:     General: No focal deficit present.     Mental Status: She is alert.  Psychiatric:        Mood and Affect: Mood normal.     ED Results / Procedures / Treatments   EKG EKG Interpretation  Date/Time:  Sunday April 12 2022 05:54:12 EDT Ventricular Rate:  93 PR Interval:  133 QRS Duration: 94 QT Interval:  351 QTC Calculation: 437 R Axis:   69 Text Interpretation: Sinus rhythm Normal ECG No old tracing to compare Confirmed by Calvert Cantor [phone]) on 04/12/2022 5:55:46  AM  Procedures Procedures  Medications Ordered in the ED Medications  alum & mag hydroxide-simeth (MAALOX/MYLANTA) 200-200-20 MG/5ML suspension 30 mL (30 mLs Oral Given 04/12/22 0618)    Initial Impression and Plan  Patient here with atypical chest pain, ongoing for about 24 hours. Low risk for CAD/ACS. PERC neg. Will check labs and CXR. EKG is normal. GI cocktail for symptom management.   ED Course   Clinical Course as of 04/12/22 0654  Sun Apr 12, 2022  0620 CBC is normal.  [CS]  [id number] BMP is normal.  [CS]  [phone] I personally viewed the images from radiology studies and agree with radiologist interpretation: CXR suspicious for bronchitis [CS]  [id number] Troponin is normal. Patient reports some improvement after GI cocktail. She is reassured no signs of ACS and is comfortable with  managing her symptoms at home. Recommend PCP follow up, RTED for any other concerns.   [CS]    Clinical Course User Index [CS] Truddie Hidden, MD     MDM Rules/Calculators/A&P Medical Decision Making Problems Addressed: Atypical chest pain: acute illness or injury  Amount and/or Complexity of Data Reviewed Labs: ordered. Decision-making details documented in ED Course. Radiology: ordered and independent interpretation performed. Decision-making details documented in ED Course. ECG/medicine tests: ordered and independent interpretation performed. Decision-making details documented in ED Course.  Risk OTC drugs. Prescription drug management.     Final Clinical Impression(s) / ED Diagnoses Final diagnoses:  Atypical chest pain    Rx / DC Orders ED Discharge Orders     None        Truddie Hidden, MD 04/12/22 [phone]

## 2023-02-10 ENCOUNTER — Encounter (HOSPITAL_BASED_OUTPATIENT_CLINIC_OR_DEPARTMENT_OTHER): Payer: Self-pay

## 2023-02-10 ENCOUNTER — Other Ambulatory Visit: Payer: Self-pay

## 2023-02-10 ENCOUNTER — Emergency Department (HOSPITAL_BASED_OUTPATIENT_CLINIC_OR_DEPARTMENT_OTHER)
Admission: EM | Admit: 2023-02-10 | Discharge: 2023-02-10 | Disposition: A | Discharge status: Home / Self Care | Payer: 59 | Attending: Emergency Medicine | Admitting: Emergency Medicine

## 2023-02-10 DIAGNOSIS — R112 Nausea with vomiting, unspecified: Secondary | ICD-10-CM | POA: Diagnosis present

## 2023-02-10 DIAGNOSIS — K529 Noninfective gastroenteritis and colitis, unspecified: Secondary | ICD-10-CM | POA: Diagnosis not present

## 2023-02-10 LAB — CBC WITH DIFFERENTIAL/PLATELET
Abs Immature Granulocytes: 0.01 10*3/uL (ref 0.00–0.07)
Basophils Absolute: 0 10*3/uL (ref 0.0–0.1)
Basophils Relative: 0 %
Eosinophils Absolute: 0.3 10*3/uL (ref 0.0–0.5)
Eosinophils Relative: 3 %
HCT: 40.5 % (ref 36.0–46.0)
Hemoglobin: 14 g/dL (ref 12.0–15.0)
Immature Granulocytes: 0 %
Lymphocytes Relative: 16 %
Lymphs Abs: 1.4 10*3/uL (ref 0.7–4.0)
MCH: 30.3 pg (ref 26.0–34.0)
MCHC: 34.6 g/dL (ref 30.0–36.0)
MCV: 87.7 fL (ref 80.0–100.0)
Monocytes Absolute: 0.7 10*3/uL (ref 0.1–1.0)
Monocytes Relative: 8 %
Neutro Abs: 6.3 10*3/uL (ref 1.7–7.7)
Neutrophils Relative %: 73 %
Platelets: 213 10*3/uL (ref 150–400)
RBC: 4.62 MIL/uL (ref 3.87–5.11)
RDW: 12.4 % (ref 11.5–15.5)
WBC: 8.6 10*3/uL (ref 4.0–10.5)
nRBC: 0 % (ref 0.0–0.2)

## 2023-02-10 LAB — COMPREHENSIVE METABOLIC PANEL
ALT: 18 U/L (ref 0–44)
AST: 20 U/L (ref 15–41)
Albumin: 4.5 g/dL (ref 3.5–5.0)
Alkaline Phosphatase: 62 U/L (ref 38–126)
Anion gap: 10 (ref 5–15)
BUN: 10 mg/dL (ref 6–20)
CO2: 22 mmol/L (ref 22–32)
Calcium: 9.3 mg/dL (ref 8.9–10.3)
Chloride: 103 mmol/L (ref 98–111)
Creatinine, Ser: 0.72 mg/dL (ref 0.44–1.00)
GFR, Estimated: >60 mL/min (ref >60)
Glucose, Bld: 114 mg/dL — ABNORMAL HIGH (ref 70–99)
Potassium: 3.6 mmol/L (ref 3.5–5.1)
Sodium: 135 mmol/L (ref 135–145)
Total Bilirubin: 0.7 mg/dL (ref 0.0–1.2)
Total Protein: 7.5 g/dL (ref 6.5–8.1)

## 2023-02-10 LAB — LIPASE, BLOOD: Lipase: 44 U/L (ref 11–51)

## 2023-02-10 MED ORDER — ALUM & MAG HYDROXIDE-SIMETH 200-200-20 MG/5ML PO SUSP
30.0000 mL | Freq: Once | ORAL | Status: AC
  Administered 2023-02-10: 30 mL via ORAL
  Filled 2023-02-10: qty 30

## 2023-02-10 MED ORDER — SODIUM CHLORIDE 0.9 % IV BOLUS
1000.0000 mL | Freq: Once | INTRAVENOUS | Status: AC
  Administered 2023-02-10: 1000 mL via INTRAVENOUS

## 2023-02-10 MED ORDER — ONDANSETRON 4 MG PO TBDP: 4.0000 mg | ORAL_TABLET | Freq: Three times a day (TID) | ORAL | 0 refills | Status: AC | PRN

## 2023-02-10 MED ORDER — ONDANSETRON HCL 4 MG/2ML IJ SOLN
4.0000 mg | Freq: Once | INTRAMUSCULAR | Status: AC
  Administered 2023-02-10: 4 mg via INTRAVENOUS
  Filled 2023-02-10: qty 2

## 2023-02-10 NOTE — ED Provider Notes (Signed)
 [place] [facility]  Provider Note  CSN: [id number] Arrival date & time: 02/10/23 9666  History Chief Complaint  Patient presents with   Emesis    Marilyn Ingram is a 48 y.o. female with history of IBS, previously on linzess, reports she also recently started semagltide for weight loss. She was having some abdominal pain and nausea last week, went to UC where she had labs and an xray demonstrating gas and constipation. She restarted her linzess and noted stools have become loose, but not watery and her other symptoms had improved. During the day 1/7, she began to have nausea and vomiting, multiple episodes and unable to keep down liquids. No fever, no abdominal pain. No blood.    Home Medications Prior to Admission medications   Medication Sig Start Date End Date Taking? Authorizing Provider  ondansetron  (ZOFRAN -ODT) 4 MG disintegrating tablet Take 1 tablet (4 mg total) by mouth every 8 (eight) hours as needed for nausea or vomiting. 02/10/23  Yes Roselyn Carlin NOVAK, MD  acetaminophen  (TYLENOL ) 325 MG tablet Take 325 mg by mouth every 6 (six) hours as needed.    [provider]  buPROPion (WELLBUTRIN) 100 MG tablet Take 100 mg by mouth 2 (two) times daily.    [provider]  HYDROcodone-acetaminophen  (NORCO/VICODIN) 5-325 MG tablet Take 1 tablet by mouth every 6 (six) hours as needed for moderate pain.    [provider]  pantoprazole (PROTONIX) 20 MG tablet Take 20 mg by mouth daily.    [provider]  promethazine (PHENERGAN) 12.5 MG tablet Take 12.5 mg by mouth every 6 (six) hours as needed. 08/14/19   [provider]     Allergies    Butorphanol, Medroxyprogesterone, Meperidine, and Meperidine hcl   Review of Systems   Review of Systems Please see HPI for pertinent positives and negatives  Physical Exam BP 119/82 (BP Location: Right Arm)   Pulse 91   Temp (!) 97.4 F (36.3 C)   Resp 18   Ht  5' 5 (1.651 m)   Wt 85.3 kg   SpO2 100%   BMI 31.28 kg/m   Physical Exam Vitals and nursing note reviewed.  Constitutional:      Appearance: Normal appearance.  HENT:     Head: Normocephalic and atraumatic.     Nose: Nose normal.     Mouth/Throat:     Mouth: Mucous membranes are dry.  Eyes:     Extraocular Movements: Extraocular movements intact.     Conjunctiva/sclera: Conjunctivae normal.  Cardiovascular:     Rate and Rhythm: Normal rate.  Pulmonary:     Effort: Pulmonary effort is normal.     Breath sounds: Normal breath sounds.  Abdominal:     General: Abdomen is flat.     Palpations: Abdomen is soft.     Tenderness: There is no abdominal tenderness.  Musculoskeletal:        General: No swelling. Normal range of motion.     Cervical back: Neck supple.  Skin:    General: Skin is warm and dry.  Neurological:     General: No focal deficit present.     Mental Status: She is alert.  Psychiatric:        Mood and Affect: Mood normal.     ED Results / Procedures / Treatments   EKG None  Procedures Procedures  Medications Ordered in the ED Medications  ondansetron  (ZOFRAN ) injection 4 mg (4 mg Intravenous Given 02/10/23  0408)  sodium chloride  0.9 % bolus 1,000 mL (0 mLs Intravenous Stopped 02/10/23 0516)  alum & mag hydroxide-simeth (MAALOX/MYLANTA) 200-200-20 MG/5ML suspension 30 mL (30 mLs Oral Given 02/10/23 0519)    Initial Impression and Plan  Patient here with several hours of N/V. She has a benign abdomen. Previous symptoms attributed to IBS and/or semaglutide have resolved, this seems to be a separate issue. Will check labs, give Zofran /IVF for comfort. Abdomen is benign now, no indication for imaging at this time.   ED Course   Clinical Course as of 02/10/23 0542  Wed Feb 10, 2023  0416 CBC is normal.  [CS]  0433 CMP and lipase are unremarkable.  [CS]  [id number] Patient feeling better, still some mild nausea, but no more vomiting. Will give PO trial and  reassess.  [CS]  0541 Patient tolerating PO fluids well, no further vomiting. Plan discharge with Rx for Zofran , recommend she advance diet as tolerated. PCP follow up, RTED for any other concerns.   [CS]    Clinical Course User Index [CS] Roselyn Carlin NOVAK, MD     MDM Rules/Calculators/A&P Medical Decision Making Given presenting complaint, I considered that admission might be necessary. After review of results from ED lab and/or imaging studies, admission to the hospital is not indicated at this time.    Problems Addressed: Gastroenteritis: acute illness or injury  Amount and/or Complexity of Data Reviewed Labs: ordered. Decision-making details documented in ED Course.  Risk OTC drugs. Prescription drug management. Decision regarding hospitalization.     Final Clinical Impression(s) / ED Diagnoses Final diagnoses:  Gastroenteritis    Rx / DC Orders ED Discharge Orders          Ordered    ondansetron  (ZOFRAN -ODT) 4 MG disintegrating tablet  Every 8 hours PRN        02/10/23 0542             Roselyn Carlin NOVAK, MD 02/10/23 [phone]

## 2023-02-10 NOTE — ED Notes (Signed)
 Pt vomited in room and reports it was brown/green.

## 2023-02-10 NOTE — ED Triage Notes (Signed)
 Pt on semaglutide; reports she was worked up at UC 1 week ago for abd issues and she had been constipated. Has not had normal BM since Sunday. Pt now endorses diarrhea and has been vomiting green since 6pm. Vomits every 1-2 hours. Pt currently vomiting now and has little appetite.

## 2023-02-10 NOTE — ED Notes (Signed)
 Pt states she has always had constipation issues even prior to starting semaglutide. But she reports it is worse now.
